# Patient Record
Sex: Male | Born: 1965 | Race: White | Hispanic: No | Marital: Married | Smoking: Never smoker
Health system: Southern US, Community
[De-identification: ages and names within clinical notes are randomized; demographics above are authoritative.]

## PROBLEM LIST (undated history)

## (undated) DIAGNOSIS — K219 Gastro-esophageal reflux disease without esophagitis: Secondary | ICD-10-CM

## (undated) DIAGNOSIS — E785 Hyperlipidemia, unspecified: Secondary | ICD-10-CM

## (undated) DIAGNOSIS — F419 Anxiety disorder, unspecified: Secondary | ICD-10-CM

## (undated) HISTORY — DX: Anxiety disorder, unspecified: F41.9

## (undated) HISTORY — PX: TONSILECTOMY, ADENOIDECTOMY, BILATERAL MYRINGOTOMY AND TUBES: SHX2538

## (undated) HISTORY — DX: Gastro-esophageal reflux disease without esophagitis: K21.9

## (undated) HISTORY — DX: Hyperlipidemia, unspecified: E78.5

## (undated) HISTORY — PX: OTHER SURGICAL HISTORY: SHX169

## (undated) HISTORY — PX: HERNIA REPAIR: SHX51

---

## 2014-08-25 ENCOUNTER — Emergency Department
Admit: 2014-08-25 | Disposition: A | Discharge status: Home / Self Care | Payer: Self-pay | Admitting: Emergency Medicine

## 2014-08-25 LAB — BASIC METABOLIC PANEL
ANION GAP: 5 — AB (ref 7–16)
BUN: 16 mg/dL
CO2: 28 mmol/L
CREATININE: 0.82 mg/dL
Calcium, Total: 8.9 mg/dL
Chloride: 106 mmol/L
EGFR (Non-African Amer.): >60
Glucose: 123 mg/dL — ABNORMAL HIGH
POTASSIUM: 3.8 mmol/L
Sodium: 139 mmol/L

## 2014-08-25 LAB — CBC
HCT: 43.9 % (ref 40.0–52.0)
HGB: 14.9 g/dL (ref 13.0–18.0)
MCH: 29.4 pg (ref 26.0–34.0)
MCHC: 33.9 g/dL (ref 32.0–36.0)
MCV: 87 fL (ref 80–100)
Platelet: 254 10*3/uL (ref 150–440)
RBC: 5.06 10*6/uL (ref 4.40–5.90)
RDW: 13.1 % (ref 11.5–14.5)
WBC: 7.9 10*3/uL (ref 3.8–10.6)

## 2014-08-25 LAB — TROPONIN I

## 2015-01-02 ENCOUNTER — Other Ambulatory Visit: Payer: Self-pay | Admitting: Ophthalmology

## 2015-01-02 DIAGNOSIS — H5712 Ocular pain, left eye: Secondary | ICD-10-CM

## 2015-01-02 DIAGNOSIS — H5022 Vertical strabismus, left eye: Secondary | ICD-10-CM

## 2015-01-16 ENCOUNTER — Ambulatory Visit
Admission: RE | Admit: 2015-01-16 | Discharge: 2015-01-16 | Disposition: A | Discharge status: Home / Self Care | Payer: Managed Care, Other (non HMO) | Source: Ambulatory Visit | Attending: Ophthalmology | Admitting: Ophthalmology

## 2015-01-16 DIAGNOSIS — H5022 Vertical strabismus, left eye: Secondary | ICD-10-CM

## 2015-01-16 DIAGNOSIS — H5712 Ocular pain, left eye: Secondary | ICD-10-CM

## 2015-01-19 ENCOUNTER — Other Ambulatory Visit: Payer: Managed Care, Other (non HMO)

## 2015-01-26 ENCOUNTER — Ambulatory Visit: Payer: Managed Care, Other (non HMO)

## 2015-01-26 MED ORDER — GADOBENATE DIMEGLUMINE 529 MG/ML IV SOLN
20.0000 mL | Freq: Once | INTRAVENOUS | Status: AC | PRN
  Administered 2015-01-26: 20 mL via INTRAVENOUS

## 2015-01-28 ENCOUNTER — Ambulatory Visit (INDEPENDENT_AMBULATORY_CARE_PROVIDER_SITE_OTHER): Payer: Managed Care, Other (non HMO)

## 2015-01-28 ENCOUNTER — Other Ambulatory Visit: Payer: Self-pay | Admitting: Ophthalmology

## 2015-01-28 DIAGNOSIS — H5712 Ocular pain, left eye: Secondary | ICD-10-CM

## 2016-07-20 ENCOUNTER — Ambulatory Visit: Payer: Self-pay

## 2016-08-17 ENCOUNTER — Encounter: Payer: Self-pay | Admitting: Urology

## 2016-08-17 ENCOUNTER — Ambulatory Visit (INDEPENDENT_AMBULATORY_CARE_PROVIDER_SITE_OTHER): Payer: Managed Care, Other (non HMO) | Admitting: Urology

## 2016-08-17 VITALS — BP 113/72 | HR 77 | Ht 72.0 in | Wt 255.8 lb

## 2016-08-17 DIAGNOSIS — N50819 Testicular pain, unspecified: Secondary | ICD-10-CM

## 2016-08-17 LAB — URINALYSIS, COMPLETE
Bilirubin, UA: NEGATIVE
Glucose, UA: NEGATIVE
Ketones, UA: NEGATIVE
LEUKOCYTES UA: NEGATIVE
NITRITE UA: NEGATIVE
Protein, UA: NEGATIVE
SPEC GRAV UA: 1.025 (ref 1.005–1.030)
Urobilinogen, Ur: 0.2 mg/dL (ref 0.2–1.0)
pH, UA: 5.5 (ref 5.0–7.5)

## 2016-08-17 NOTE — Progress Notes (Signed)
08/17/2016 3:22 PM   Sean Hess 10/20/1965 [id number]  Referring provider: Dione Housekeeper, MD [address] [place], [place] [address]  CC: Left testicular pain  HPI: The patient is a 51 year old gentleman with a past medical history significant for situational anxiety as well as left inguinal hernia repair who presents today to discuss pain in left testicle.  This has been present for approximately 6 months. He describes it as a dull pain is currently oe out of month 10 but has been at worst 8 out of 10. This improved the last few weeks. He describes his pain that radiates to his left testicle that is worse with walking and movement. Laying down and stretching his leg out makes it better. He has seen a general surgeon who does not feel a hernia there. He is also a CT scan which did not show a hernia. He is concerned that he has a UTI. He does report a heavy pressure feeling when he lays in bed at night causing him to need to go to the bathroom frequency. He has nocturia 1-10 times per night. He states he only sleeps 3-4 hours per night and usually lies there the rest of the night. He will get the urge to go and then she'll have to well he lays in bed.  The patient's referral note also mentions concern for hematuria. In review of all records available to me the patient has never had a positive microscopic urinalysis for blood. His have positive dipsticks which are unreliable for diagnosing hematuria and used solely for screening. Today's microscopic urinalysis is negative for blood and is completely normal.  Review of the ultrasound report from an outside hospital shows a tiny left varicocele and trace hydroceles.  PMH: Past Medical History:  Diagnosis Date  . Anxiety   . GERD (gastroesophageal reflux disease)     Surgical History: Past Surgical History:  Procedure Laterality Date  . HERNIA REPAIR    . TONSILECTOMY, ADENOIDECTOMY, BILATERAL MYRINGOTOMY AND TUBES      Home  Medications:  Allergies as of 08/17/2016   No Known Allergies     Medication List       Accurate as of 08/17/16  3:22 PM. Always use your most recent med list.          ALPRAZolam 0.5 MG tablet Commonly known as:  XANAX       Allergies: No Known Allergies  Family History: Family History  Problem Relation Age of Onset  . Bladder Cancer Father   . Prostate cancer Neg Hx   . Kidney cancer Neg Hx     Social History:  reports that he has never smoked. He has never used smokeless tobacco. He reports that he drinks alcohol. He reports that he does not use drugs.  ROS: UROLOGY Frequent Urination?: Yes Hard to postpone urination?: Yes Burning/pain with urination?: No Get up at night to urinate?: Yes Leakage of urine?: No Urine stream starts and stops?: No Trouble starting stream?: No Do you have to strain to urinate?: No Blood in urine?: No Urinary tract infection?: No Sexually transmitted disease?: No Injury to kidneys or bladder?: No Painful intercourse?: No Weak stream?: No Erection problems?: No Penile pain?: No  Gastrointestinal Nausea?: No Vomiting?: No Indigestion/heartburn?: No Diarrhea?: No Constipation?: No  Constitutional Fever: No Night sweats?: No Weight loss?: No Fatigue?: No  Skin Skin rash/lesions?: No Itching?: No  Eyes Blurred vision?: No Double vision?: No  Ears/Nose/Throat Sore throat?: No Sinus problems?:  No  Hematologic/Lymphatic Swollen glands?: No Easy bruising?: No  Cardiovascular Leg swelling?: No Chest pain?: No  Respiratory Cough?: No Shortness of breath?: No  Endocrine Excessive thirst?: No  Musculoskeletal Back pain?: Yes Joint pain?: No  Neurological Headaches?: No Dizziness?: No  Psychologic Depression?: No Anxiety?: No  Physical Exam: BP 113/72 (BP Location: Left Arm, Patient Position: Sitting, Cuff Size: Large)   Pulse 77   Ht 6' (1.829 m)   Wt 255 lb 12.8 oz (116 kg)   BMI 34.69 kg/m     Constitutional:  Alert and oriented, No acute distress. HEENT: [place] AT, moist mucus membranes.  Trachea midline, no masses. Cardiovascular: No clubbing, cyanosis, or edema. Respiratory: Normal respiratory effort, no increased work of breathing. GI: Abdomen is soft, nontender, nondistended, no abdominal masses GU: No CVA tenderness. Normal phallus. Testicles descended equally bilaterally. Small left varicocele. No nodules or masses. Skin: No rashes, bruises or suspicious lesions. Lymph: No cervical or inguinal adenopathy. Neurologic: Grossly intact, no focal deficits, moving all 4 extremities. Psychiatric: Normal mood and affect.  Laboratory Data: Lab Results  Component Value Date   WBC 7.9 08/25/2014   HGB 14.9 08/25/2014   HCT 43.9 08/25/2014   MCV 87 08/25/2014   PLT 254 08/25/2014    Lab Results  Component Value Date   CREATININE 0.82 08/25/2014    No results found for: PSA  No results found for: TESTOSTERONE  No results found for: HGBA1C  Urinalysis No results found for: COLORURINE, APPEARANCEUR, LABSPEC, PHURINE, GLUCOSEU, HGBUR, BILIRUBINUR, KETONESUR, PROTEINUR, UROBILINOGEN, NITRITE, LEUKOCYTESUR    Assessment & Plan:    1. Left testicular pain The patient has no apparent infectious etiology for his left testicular pain. It appears more musculoskeletal in origin. Since it is improving, I do not think he needs much intervention this time. He returns, I recommended NSAID therapy. We could also consider physical therapy. Follow up prn  2. Nocturia The patient has nocturia think is more related to his inability to fall asleep as it is not awaking him from bed at night. I have offered to try him on the overactive bladder medication to see if this may help him but he is not interested at this time to call the office if he changes his mind.  3. Dipstick positive red blood cells The patient has only dipstick positive red blood cells which is an error a large amount of  time. On examination today he has no microscopic hematuria. I have no evidence that he has ever had microscopic hematuria. The lab reports that he brought today where he was told that hematuria only showed dipstick positive without microscopic examination. No further workup needed unless the patient is proven to have microscopic hematuria after evaluation of urine under high-powered field.  Return if symptoms worsen or fail to improve.  Hildred Laser, MD  [facility] [address] [place], [place] [address] [phone]

## 2016-11-29 ENCOUNTER — Ambulatory Visit: Payer: Managed Care, Other (non HMO)

## 2017-04-28 IMAGING — MR MR ORBITS WO/W CM
4 of 7 series · 23 of 48 positions shown · IV contrast (multihance)
Comparison: None.

CLINICAL DATA: Left eye pain

EXAM:
MRI OF THE ORBITS WITHOUT AND WITH CONTRAST
TECHNIQUE: Multiplanar, multisequence MR imaging of the orbits was performed
both before and after the administration of intravenous contrast.
CONTRAST:  20 mL MultiHance IV

[Series 2: T1 · sagittal · 5.0mm · 0.45mm/px · 7 of 25 slices shown (1 of 2)]
[im 1/25]
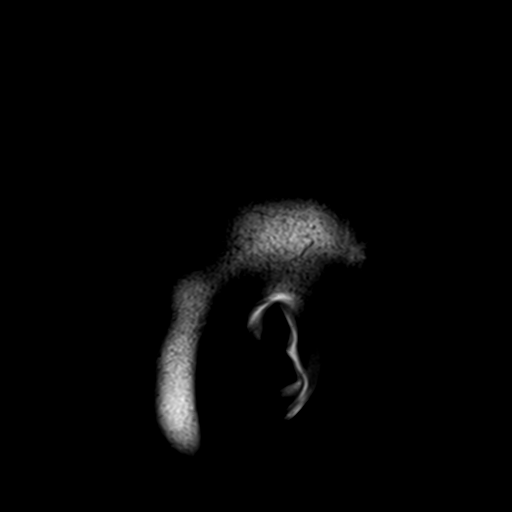
[im 5/25]
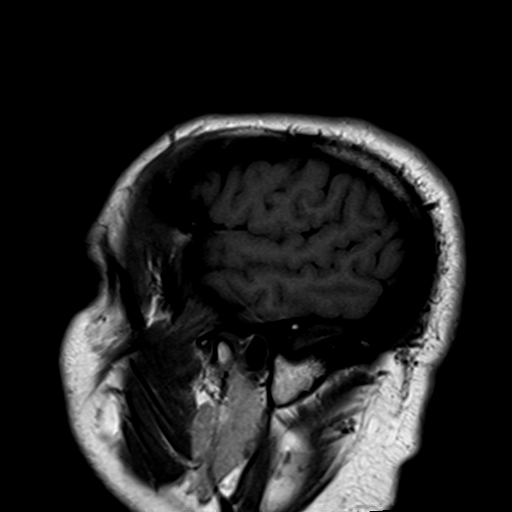
[im 9/25]
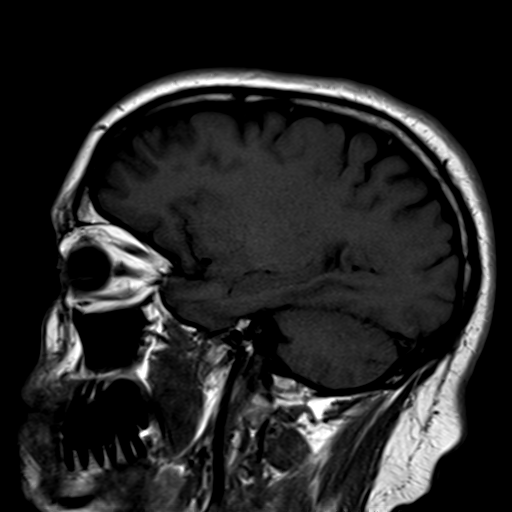
[im 13/25]
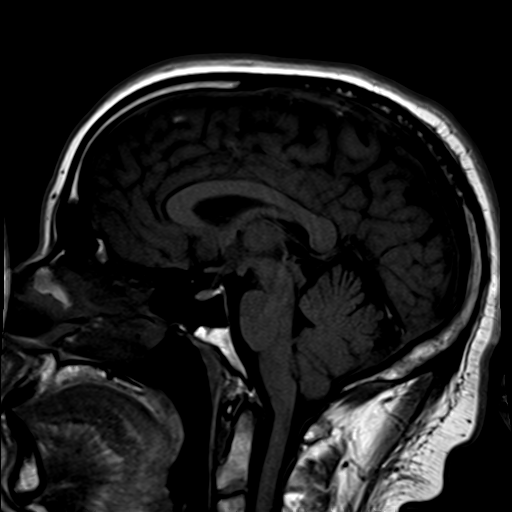
[im 17/25]
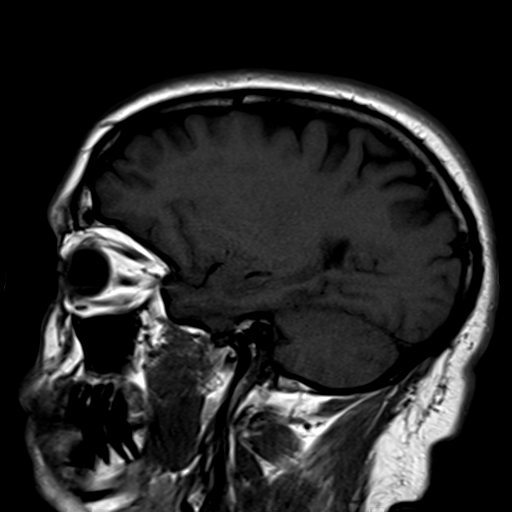
[im 21/25]
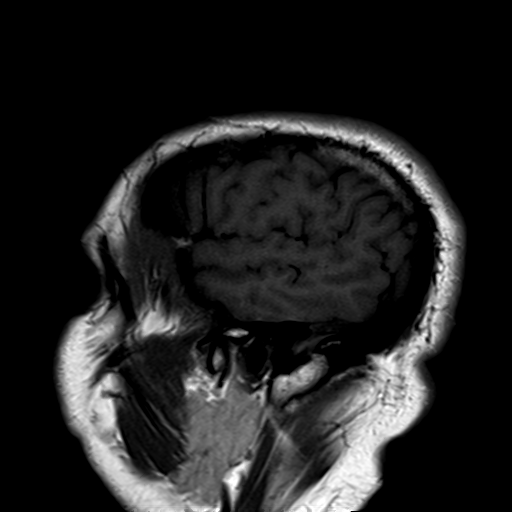
[im 25/25]
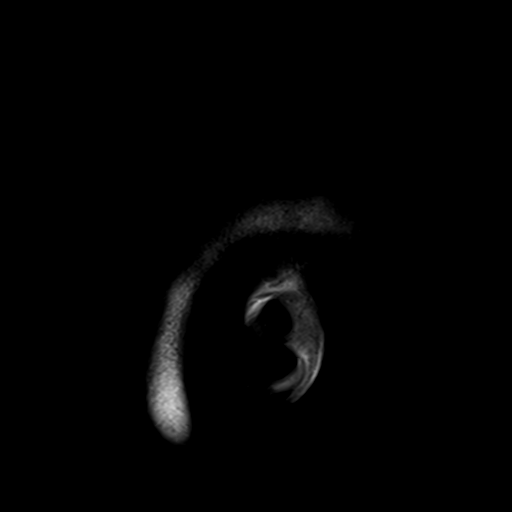

[Series 3: T2 fat-sat · axial · 3.0mm · 0.62mm/px · z∈[-35,+17]mm · 5 of 17 slices shown (1 of 2)]
[im 1/17]
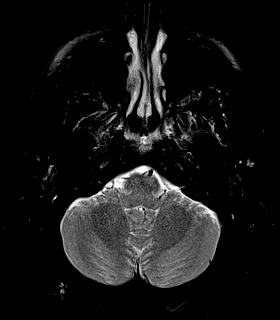
[im 5/17]
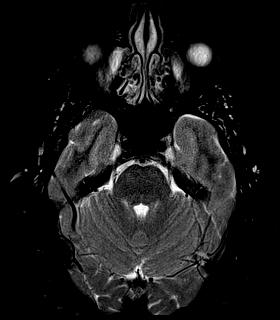
[im 9/17]
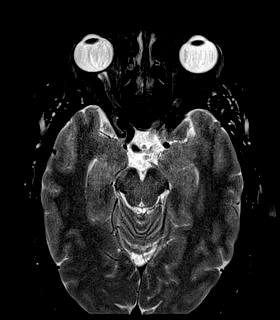
[im 13/17]
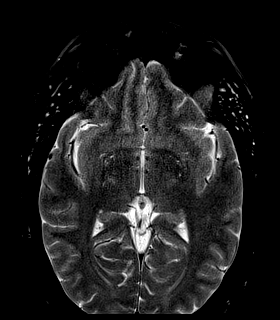
[im 17/17]
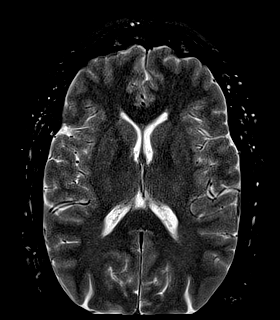

[Series 4: T2 fat-sat · coronal · 3.0mm · 0.39mm/px · 8 of 29 slices shown (2 of 2)]
[im 1/29]
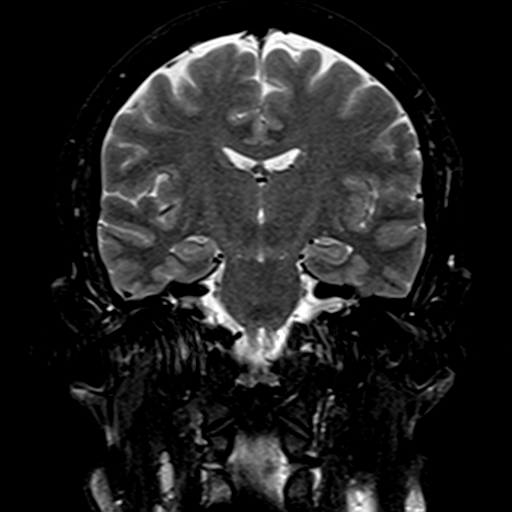
[im 5/29]
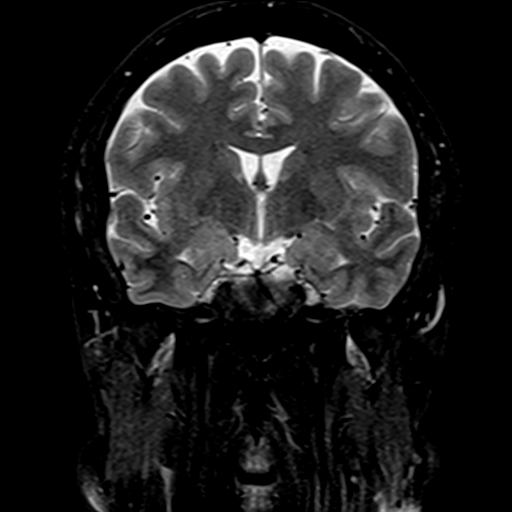
[im 9/29]
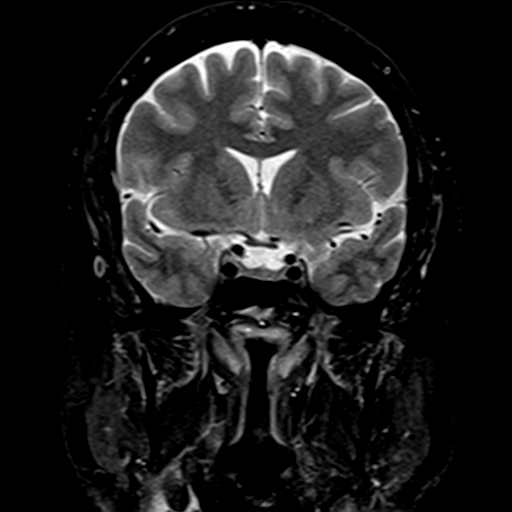
[im 13/29]
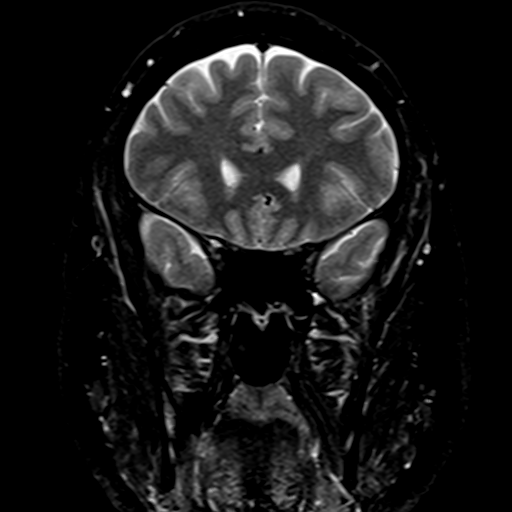
[im 17/29]
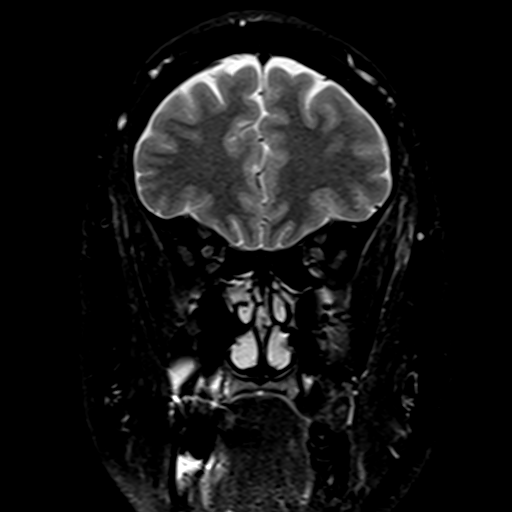
[im 21/29]
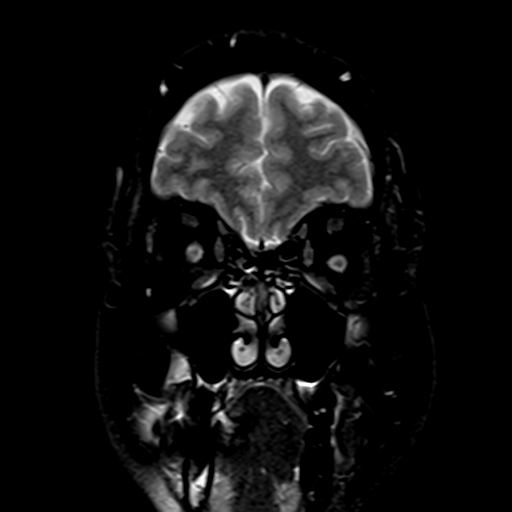
[im 25/29]
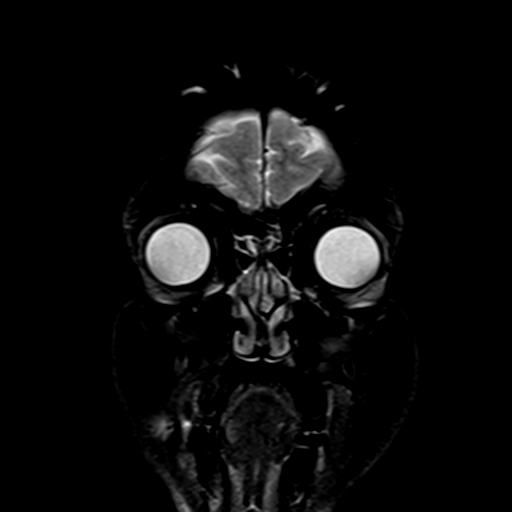
[im 29/29]
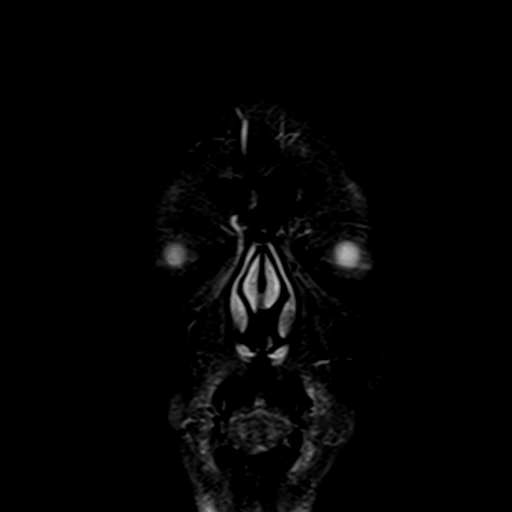

[Series 5: T1 · axial · 3.0mm · 0.39mm/px · z∈[-36,+20]mm · 3 of 18 slices shown (2 of 2)]
[im 1/18]
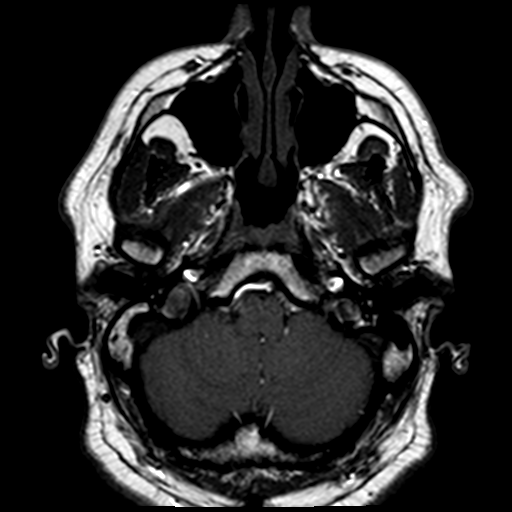
[im 9/18]
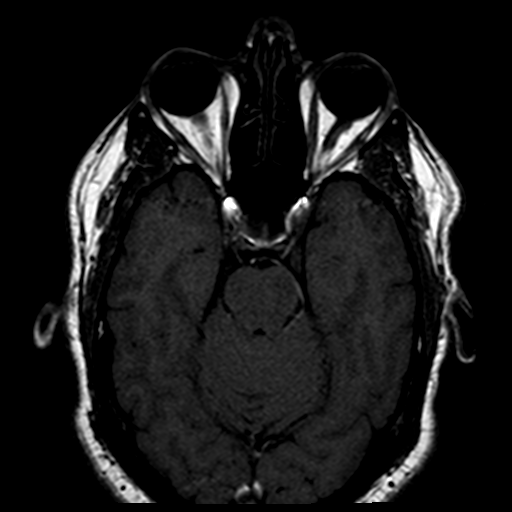
[im 18/18]
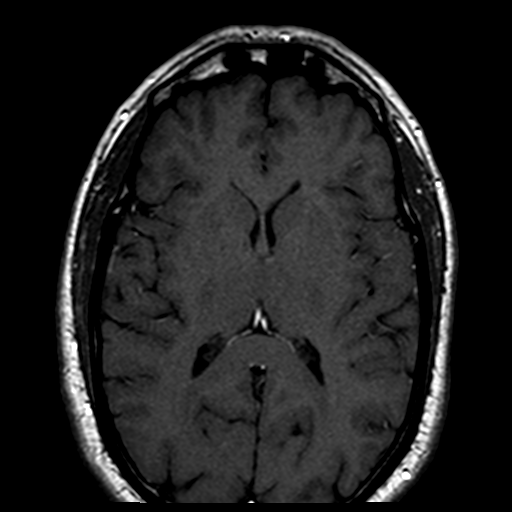

[23 of 48 positions shown; findings below may reference images not displayed]

FINDINGS: The globe is normal in appearance. Extraocular muscles are normal.
Optic nerve is normal in caliber and signal bilaterally. Orbital fat
is normal. No edema. Negative for mass lesion in the orbit. Lacrimal
gland is normal bilaterally.

Cavernous sinus is normal bilaterally. Pituitary not enlarged. Optic
chiasm appears normal.

Normal enhancement following contrast infusion

Visualized paranasal sinuses are clear.

Limited intracranial imaging negative.
IMPRESSION: Negative MRI of the orbits with contrast.

## 2022-11-01 ENCOUNTER — Inpatient Hospital Stay: Payer: Managed Care, Other (non HMO) | Attending: Oncology | Admitting: Oncology

## 2022-11-01 ENCOUNTER — Inpatient Hospital Stay: Payer: Managed Care, Other (non HMO)

## 2022-11-01 ENCOUNTER — Encounter: Payer: Self-pay | Admitting: Oncology

## 2022-11-01 ENCOUNTER — Other Ambulatory Visit: Payer: Self-pay

## 2022-11-01 VITALS — BP 118/85 | HR 73 | Temp 97.7°F | Resp 16 | Ht 72.0 in | Wt 246.0 lb

## 2022-11-01 DIAGNOSIS — R803 Bence Jones proteinuria: Secondary | ICD-10-CM

## 2022-11-01 DIAGNOSIS — Z8052 Family history of malignant neoplasm of bladder: Secondary | ICD-10-CM | POA: Diagnosis not present

## 2022-11-01 DIAGNOSIS — Z9089 Acquired absence of other organs: Secondary | ICD-10-CM | POA: Diagnosis not present

## 2022-11-01 DIAGNOSIS — K219 Gastro-esophageal reflux disease without esophagitis: Secondary | ICD-10-CM | POA: Diagnosis not present

## 2022-11-01 DIAGNOSIS — R109 Unspecified abdominal pain: Secondary | ICD-10-CM | POA: Diagnosis not present

## 2022-11-01 DIAGNOSIS — Z79899 Other long term (current) drug therapy: Secondary | ICD-10-CM | POA: Diagnosis not present

## 2022-11-01 DIAGNOSIS — Z881 Allergy status to other antibiotic agents status: Secondary | ICD-10-CM | POA: Insufficient documentation

## 2022-11-01 LAB — CBC WITH DIFFERENTIAL/PLATELET
Abs Immature Granulocytes: 0.03 10*3/uL (ref 0.00–0.07)
Basophils Absolute: 0 10*3/uL (ref 0.0–0.1)
Basophils Relative: 0 %
Eosinophils Absolute: 0.1 10*3/uL (ref 0.0–0.5)
Eosinophils Relative: 1 %
HCT: 44.4 % (ref 39.0–52.0)
Hemoglobin: 14.9 g/dL (ref 13.0–17.0)
Immature Granulocytes: 0 %
Lymphocytes Relative: 27 %
Lymphs Abs: 2.1 10*3/uL (ref 0.7–4.0)
MCH: 29.6 pg (ref 26.0–34.0)
MCHC: 33.6 g/dL (ref 30.0–36.0)
MCV: 88.3 fL (ref 80.0–100.0)
Monocytes Absolute: 0.6 10*3/uL (ref 0.1–1.0)
Monocytes Relative: 8 %
Neutro Abs: 4.9 10*3/uL (ref 1.7–7.7)
Neutrophils Relative %: 64 %
Platelets: 243 10*3/uL (ref 150–400)
RBC: 5.03 MIL/uL (ref 4.22–5.81)
RDW: 12.5 % (ref 11.5–15.5)
WBC: 7.7 10*3/uL (ref 4.0–10.5)
nRBC: 0 % (ref 0.0–0.2)

## 2022-11-01 LAB — BASIC METABOLIC PANEL
Anion gap: 7 (ref 5–15)
BUN: 19 mg/dL (ref 6–20)
CO2: 28 mmol/L (ref 22–32)
Calcium: 9 mg/dL (ref 8.9–10.3)
Chloride: 102 mmol/L (ref 98–111)
Creatinine, Ser: 0.81 mg/dL (ref 0.61–1.24)
GFR, Estimated: >60 mL/min (ref >60)
Glucose, Bld: 137 mg/dL — ABNORMAL HIGH (ref 70–99)
Potassium: 3.8 mmol/L (ref 3.5–5.1)
Sodium: 137 mmol/L (ref 135–145)

## 2022-11-01 NOTE — Progress Notes (Signed)
[facility]  Telephone:(336) [phone] Fax:(336) [phone]  ID: Sean Hess OB: August 06, 1965  MR#: [id number]  EXB#:[id number]  Patient Care Team: [facility] as [facility]CP - General  CHIEF COMPLAINT: Bence-Jones proteinuria.  INTERVAL HISTORY: Patient is a 57 year old male who was noted to have increased protein levels and urine and found to have a positive UPEP.  SPEP was negative.  He is anxious, but otherwise feels well.  He has no neurologic complaints.  He denies any recent fevers or illnesses.  He has good appetite and denies weight loss.  He has new onset abdominal pain but denies any nausea, vomiting, constipation, or diarrhea.  He has no urinary complaints.  Patient offers no further specific complaints today.  REVIEW OF SYSTEMS:   Review of Systems  Constitutional: Negative.  Negative for fever, malaise/fatigue and weight loss.  Respiratory: Negative.  Negative for cough, hemoptysis and shortness of breath.   Cardiovascular: Negative.  Negative for chest pain and leg swelling.  Gastrointestinal:  Positive for abdominal pain. Negative for blood in stool, constipation, diarrhea, melena, nausea and vomiting.  Genitourinary: Negative.  Negative for dysuria.  Musculoskeletal: Negative.  Negative for back pain.  Skin: Negative.  Negative for rash.  Neurological: Negative.  Negative for dizziness, speech change, focal weakness and headaches.  Psychiatric/Behavioral:  The patient is nervous/anxious.     As per HPI. Otherwise, a complete review of systems is negative.  PAST MEDICAL HISTORY: Past Medical History:  Diagnosis Date   Anxiety    GERD (gastroesophageal reflux disease)    Hyperlipidemia     PAST SURGICAL HISTORY: Past Surgical History:  Procedure Laterality Date   HERNIA REPAIR     splenic aneurysm     TONSILECTOMY, ADENOIDECTOMY, BILATERAL MYRINGOTOMY AND TUBES      FAMILY HISTORY: Family History  Problem Relation Age of Onset    Bladder Cancer Father    Prostate cancer Neg Hx    Kidney cancer Neg Hx     ADVANCED DIRECTIVES (Y/N):  N  HEALTH MAINTENANCE: Social History   Tobacco Use   Smoking status: Never   Smokeless tobacco: Never  Substance Use Topics   Alcohol use: Yes   Drug use: No     Colonoscopy:  PAP:  Bone density:  Lipid panel:  Allergies  Allergen Reactions   Doxycycline Rash    Rash on L inside arm and R groin.   Rash on L inside arm and R groin.   Rash on L inside arm and R groin.   Rash on L inside arm and R groin.    Current Outpatient Medications  Medication Sig Dispense Refill   ALPRAZolam (XANAX) 0.5 MG tablet      simvastatin (ZOCOR) 20 MG tablet Take 20 mg by mouth daily at 6 PM.     buPROPion (WELLBUTRIN SR) 150 MG 12 hr tablet Take 150 mg by mouth 2 (two) times daily. (Patient not taking: Reported on 11/01/2022)     esomeprazole (NEXIUM) 40 MG capsule Take 40 mg by mouth. (Patient not taking: Reported on 11/01/2022)     No current facility-administered medications for this visit.    OBJECTIVE: Vitals:   11/01/22 1502  BP: 118/85  Pulse: 73  Resp: 16  Temp: 97.7 F (36.5 C)  SpO2: 98%     Body mass index is 33.36 kg/m.    ECOG FS:0 - Asymptomatic  General: Well-developed, well-nourished, no acute distress. Eyes: Pink conjunctiva, anicteric sclera. HEENT: Normocephalic, moist mucous membranes.  Lungs: No audible wheezing or coughing. Heart: Regular rate and rhythm. Abdomen: Soft, nontender, no obvious distention. Musculoskeletal: No edema, cyanosis, or clubbing. Neuro: Alert, answering all questions appropriately. Cranial nerves grossly intact. Skin: No rashes or petechiae noted. Psych: Normal affect. Lymphatics: No cervical, calvicular, axillary or inguinal LAD.   LAB RESULTS:  Lab Results  Component Value Date   NA 137 11/01/2022   K 3.8 11/01/2022   CL 102 11/01/2022   CO2 28 11/01/2022   GLUCOSE 137 (H) 11/01/2022   BUN 19 11/01/2022    CREATININE 0.81 11/01/2022   CALCIUM 9.0 11/01/2022   GFRNONAA >60 11/01/2022   GFRAA >60 08/25/2014    Lab Results  Component Value Date   WBC 7.7 11/01/2022   NEUTROABS 4.9 11/01/2022   HGB 14.9 11/01/2022   HCT 44.4 11/01/2022   MCV 88.3 11/01/2022   PLT 243 11/01/2022     STUDIES: No results found.  ASSESSMENT: Bence-Jones proteinuria.  PLAN:    Bence-Jones proteinuria: Repeat UPEP is pending at time of dictation.  Previously SPEP was negative.  Immunoglobulins and kappa/lambda free light chains have been ordered for completeness.  Patient has no evidence of endorgan damage with a normal CBC and metabolic panel.  Can consider 24-hour urine in the future.  He does not require bone marrow biopsy.  No intervention is needed at this time.  Patient will have video assisted telemedicine visit in 3 weeks to discuss his results. Abdominal pain: Unrelated to patient's Bence-Jones proteinuria.  Patient has a history of GERD and this may be possibly related to underlying gastritis.  He has follow-up with primary care first thing in the morning.  I spent a total of 45 minutes reviewing chart data, face-to-face evaluation with the patient, counseling and coordination of care as detailed above.  Patient expressed understanding and was in agreement with this plan. He also understands that He can call clinic at any time with any questions, concerns, or complaints.    Jeralyn Ruths, MD   11/01/2022 6:09 PM

## 2022-11-01 NOTE — Progress Notes (Signed)
Having abdominal pain that goes across abdomen and to left flank area x1 week.

## 2022-11-02 LAB — IGG, IGA, IGM
IgA: 253 mg/dL (ref 90–386)
IgG (Immunoglobin G), Serum: 1027 mg/dL (ref 603–1613)
IgM (Immunoglobulin M), Srm: 99 mg/dL (ref 20–172)

## 2022-11-02 LAB — KAPPA/LAMBDA LIGHT CHAINS
Kappa free light chain: 14.7 mg/L (ref 3.3–19.4)
Kappa, lambda light chain ratio: 0.99 (ref 0.26–1.65)
Lambda free light chains: 14.9 mg/L (ref 5.7–26.3)

## 2022-11-07 LAB — PROTEIN ELECTRO, RANDOM URINE
Albumin ELP, Urine: 100 %
Alpha-1-Globulin, U: 0 %
Alpha-2-Globulin, U: 0 %
Beta Globulin, U: 0 %
Gamma Globulin, U: 0 %
Total Protein, Urine: 7 mg/dL

## 2022-11-10 ENCOUNTER — Other Ambulatory Visit (HOSPITAL_COMMUNITY): Payer: Self-pay | Admitting: Family Medicine

## 2022-11-10 DIAGNOSIS — R112 Nausea with vomiting, unspecified: Secondary | ICD-10-CM

## 2022-11-15 ENCOUNTER — Ambulatory Visit: Admission: RE | Admit: 2022-11-15 | Payer: Managed Care, Other (non HMO) | Source: Ambulatory Visit

## 2022-11-25 ENCOUNTER — Inpatient Hospital Stay (HOSPITAL_BASED_OUTPATIENT_CLINIC_OR_DEPARTMENT_OTHER): Payer: Managed Care, Other (non HMO) | Admitting: Oncology

## 2022-11-25 ENCOUNTER — Encounter: Payer: Self-pay | Admitting: Oncology

## 2022-11-25 DIAGNOSIS — R803 Bence Jones proteinuria: Secondary | ICD-10-CM

## 2022-11-25 NOTE — Progress Notes (Signed)
[facility]  Telephone:(336) [phone] Fax:(336) [phone]  ID: Sean Hess OB: 1966-02-07  MR#: [id number]  YSA#:[id number]  Patient Care Team: [facility] as PCP - General  I connected with Sean Hess on 11/25/22 at 11:00 AM EDT by video enabled telemedicine visit and verified that I am speaking with the correct person using two identifiers.   I discussed the limitations, risks, security and privacy concerns of performing an evaluation and management service by telemedicine and the availability of in-person appointments. I also discussed with the patient that there may be a patient responsible charge related to this service. The patient expressed understanding and agreed to proceed.   Other persons participating in the visit and their role in the encounter: Patient, MD.  Patient's location: Home. Provider's location: Clinic.  CHIEF COMPLAINT: Bence-Jones proteinuria.  INTERVAL HISTORY: Patient agreed to video assisted telemedicine visit for further evaluation and discussion of his laboratory results.  He currently feels well and is asymptomatic.  His abdominal pain has resolved.  He has no neurologic complaints.  He denies any recent fevers or illnesses.  He has a good appetite and denies weight loss.  He denies any nausea, vomiting, constipation, or diarrhea.  He has no melena or hematochezia.  He has no urinary complaints.  Patient offers no specific complaints today.  REVIEW OF SYSTEMS:   Review of Systems  Constitutional: Negative.  Negative for fever, malaise/fatigue and weight loss.  Respiratory: Negative.  Negative for cough, hemoptysis and shortness of breath.   Cardiovascular: Negative.  Negative for chest pain and leg swelling.  Gastrointestinal: Negative.  Negative for abdominal pain, blood in stool, constipation, diarrhea, melena, nausea and vomiting.  Genitourinary: Negative.  Negative for dysuria.  Musculoskeletal: Negative.   Negative for back pain.  Skin: Negative.  Negative for rash.  Neurological: Negative.  Negative for dizziness, speech change, focal weakness and headaches.  Psychiatric/Behavioral: Negative.  The patient is not nervous/anxious.     As per HPI. Otherwise, a complete review of systems is negative.  PAST MEDICAL HISTORY: Past Medical History:  Diagnosis Date   Anxiety    GERD (gastroesophageal reflux disease)    Hyperlipidemia     PAST SURGICAL HISTORY: Past Surgical History:  Procedure Laterality Date   HERNIA REPAIR     splenic aneurysm     TONSILECTOMY, ADENOIDECTOMY, BILATERAL MYRINGOTOMY AND TUBES      FAMILY HISTORY: Family History  Problem Relation Age of Onset   Bladder Cancer Father    Prostate cancer Neg Hx    Kidney cancer Neg Hx     ADVANCED DIRECTIVES (Y/N):  N  HEALTH MAINTENANCE: Social History   Tobacco Use   Smoking status: Never   Smokeless tobacco: Never  Substance Use Topics   Alcohol use: Yes   Drug use: No     Colonoscopy:  PAP:  Bone density:  Lipid panel:  Allergies  Allergen Reactions   Doxycycline Rash    Rash on L inside arm and R groin.   Rash on L inside arm and R groin.   Rash on L inside arm and R groin.   Rash on L inside arm and R groin.    Current Outpatient Medications  Medication Sig Dispense Refill   ALPRAZolam (XANAX) 0.5 MG tablet      simvastatin (ZOCOR) 20 MG tablet Take 20 mg by mouth daily at 6 PM.     buPROPion (WELLBUTRIN SR) 150 MG 12 hr tablet Take 150 mg  by mouth 2 (two) times daily. (Patient not taking: Reported on 11/01/2022)     esomeprazole (NEXIUM) 40 MG capsule Take 40 mg by mouth. (Patient not taking: Reported on 11/01/2022)     No current facility-administered medications for this visit.    OBJECTIVE: There were no vitals filed for this visit.    There is no height or weight on file to calculate BMI.    ECOG FS:0 - Asymptomatic  General: Well-developed, well-nourished, no acute  distress. HEENT: Normocephalic. Neuro: Alert, answering all questions appropriately. Cranial nerves grossly intact. Psych: Normal affect.  LAB RESULTS:  Lab Results  Component Value Date   NA 137 11/01/2022   K 3.8 11/01/2022   CL 102 11/01/2022   CO2 28 11/01/2022   GLUCOSE 137 (H) 11/01/2022   BUN 19 11/01/2022   CREATININE 0.81 11/01/2022   CALCIUM 9.0 11/01/2022   GFRNONAA >60 11/01/2022   GFRAA >60 08/25/2014    Lab Results  Component Value Date   WBC 7.7 11/01/2022   NEUTROABS 4.9 11/01/2022   HGB 14.9 11/01/2022   HCT 44.4 11/01/2022   MCV 88.3 11/01/2022   PLT 243 11/01/2022     STUDIES: No results found.  ASSESSMENT: Bence-Jones proteinuria.  PLAN:    Bence-Jones proteinuria: Repeat UPEP is negative.  All of his other laboratory work including SPEP, immunoglobulins, kappa/lambda free light chains are also negative.  He has no evidence of endorgan damage with a normal CBC and metabolic panel.  No intervention is needed at this time.  Will repeat UPEP in 6 months to ensure it remains negative and then patient will have video-assisted telemedicine visit 1 week later.  If negative, patient likely can be discharged from clinic.   Hematuria: Likely benign.  Follow-up with primary care and/or urology as indicated.  I provided 20 minutes of face-to-face video visit time during this encounter which included chart review, counseling, and coordination of care as documented above.   Patient expressed understanding and was in agreement with this plan. He also understands that He can call clinic at any time with any questions, concerns, or complaints.    Jeralyn Ruths, MD   11/25/2022 11:18 AM

## 2023-01-06 ENCOUNTER — Other Ambulatory Visit: Payer: Self-pay

## 2023-05-29 ENCOUNTER — Inpatient Hospital Stay: Payer: Managed Care, Other (non HMO) | Attending: Oncology

## 2023-06-05 ENCOUNTER — Inpatient Hospital Stay: Payer: Managed Care, Other (non HMO) | Admitting: Oncology

## 2023-09-05 ENCOUNTER — Other Ambulatory Visit: Payer: Self-pay | Admitting: Otolaryngology

## 2023-09-05 DIAGNOSIS — R1310 Dysphagia, unspecified: Secondary | ICD-10-CM

## 2023-09-19 ENCOUNTER — Ambulatory Visit
Admission: RE | Admit: 2023-09-19 | Discharge: 2023-09-19 | Disposition: A | Discharge status: Home / Self Care | Source: Ambulatory Visit | Attending: Otolaryngology | Admitting: Otolaryngology

## 2023-09-19 DIAGNOSIS — R1310 Dysphagia, unspecified: Secondary | ICD-10-CM
